# Patient Record
Sex: Female | Born: 1975
Health system: Southern US, Community
[De-identification: ages and names within clinical notes are randomized; demographics above are authoritative.]

## PROBLEM LIST (undated history)

## (undated) DIAGNOSIS — F419 Anxiety disorder, unspecified: Secondary | ICD-10-CM

## (undated) DIAGNOSIS — T7840XA Allergy, unspecified, initial encounter: Secondary | ICD-10-CM

## (undated) DIAGNOSIS — K219 Gastro-esophageal reflux disease without esophagitis: Secondary | ICD-10-CM

## (undated) DIAGNOSIS — R87619 Unspecified abnormal cytological findings in specimens from cervix uteri: Secondary | ICD-10-CM

## (undated) HISTORY — PX: INNER EAR SURGERY: SHX679

## (undated) HISTORY — DX: Anxiety disorder, unspecified: F41.9

## (undated) HISTORY — DX: Unspecified abnormal cytological findings in specimens from cervix uteri: R87.619

## (undated) HISTORY — DX: Allergy, unspecified, initial encounter: T78.40XA

## (undated) HISTORY — DX: Gastro-esophageal reflux disease without esophagitis: K21.9

---

## 2014-05-11 ENCOUNTER — Emergency Department (HOSPITAL_COMMUNITY)
Admission: EM | Admit: 2014-05-11 | Discharge: 2014-05-11 | Disposition: A | Discharge status: Home / Self Care | Payer: 59 | Source: Home / Self Care | Attending: Family Medicine | Admitting: Family Medicine

## 2014-05-11 ENCOUNTER — Encounter (HOSPITAL_COMMUNITY): Payer: Self-pay | Admitting: Emergency Medicine

## 2014-05-11 ENCOUNTER — Ambulatory Visit (HOSPITAL_COMMUNITY): Payer: 59 | Attending: Physician Assistant

## 2014-05-11 DIAGNOSIS — J3489 Other specified disorders of nose and nasal sinuses: Secondary | ICD-10-CM | POA: Diagnosis not present

## 2014-05-11 DIAGNOSIS — R51 Headache: Secondary | ICD-10-CM | POA: Insufficient documentation

## 2014-05-11 DIAGNOSIS — J342 Deviated nasal septum: Secondary | ICD-10-CM | POA: Insufficient documentation

## 2014-05-11 DIAGNOSIS — G43911 Migraine, unspecified, intractable, with status migrainosus: Secondary | ICD-10-CM

## 2014-05-11 DIAGNOSIS — Z9889 Other specified postprocedural states: Secondary | ICD-10-CM | POA: Insufficient documentation

## 2014-05-11 MED ORDER — DEXAMETHASONE SODIUM PHOSPHATE 10 MG/ML IJ SOLN
10.0000 mg | Freq: Once | INTRAMUSCULAR | Status: AC
  Administered 2014-05-11: 10 mg via INTRAMUSCULAR

## 2014-05-11 MED ORDER — DIPHENHYDRAMINE HCL 50 MG/ML IJ SOLN
25.0000 mg | Freq: Once | INTRAMUSCULAR | Status: AC
  Administered 2014-05-11: 25 mg via INTRAMUSCULAR

## 2014-05-11 MED ORDER — ONDANSETRON 4 MG PO TBDP
4.0000 mg | ORAL_TABLET | Freq: Once | ORAL | Status: AC
  Administered 2014-05-11: 4 mg via ORAL

## 2014-05-11 MED ORDER — ONDANSETRON HCL 4 MG PO TABS: 4.0000 mg | ORAL_TABLET | Freq: Three times a day (TID) | ORAL | Status: DC | PRN

## 2014-05-11 MED ORDER — DIPHENHYDRAMINE HCL 50 MG/ML IJ SOLN: 25.0000 mg | Freq: Once | INTRAMUSCULAR | Status: DC

## 2014-05-11 MED ORDER — DEXAMETHASONE SODIUM PHOSPHATE 10 MG/ML IJ SOLN
INTRAMUSCULAR | Status: AC
  Filled 2014-05-11: qty 1

## 2014-05-11 MED ORDER — SUMATRIPTAN SUCCINATE 100 MG PO TABS: ORAL_TABLET | ORAL | Status: DC

## 2014-05-11 MED ORDER — DIPHENHYDRAMINE HCL 50 MG/ML IJ SOLN
INTRAMUSCULAR | Status: AC
Start: 2014-05-11 — End: 2014-05-11
  Filled 2014-05-11: qty 1

## 2014-05-11 MED ORDER — ONDANSETRON 4 MG PO TBDP
ORAL_TABLET | ORAL | Status: AC
  Filled 2014-05-11: qty 1

## 2014-05-11 MED ORDER — KETOROLAC TROMETHAMINE 60 MG/2ML IM SOLN
INTRAMUSCULAR | Status: AC
  Filled 2014-05-11: qty 2

## 2014-05-11 MED ORDER — KETOROLAC TROMETHAMINE 60 MG/2ML IM SOLN
60.0000 mg | Freq: Once | INTRAMUSCULAR | Status: AC
  Administered 2014-05-11: 60 mg via INTRAMUSCULAR

## 2014-05-11 NOTE — Discharge Instructions (Signed)

## 2014-05-11 NOTE — ED Provider Notes (Signed)
CSN: [id number]     Arrival date & time 05/11/14  1149 History   First MD Initiated Contact with Patient 05/11/14 1246     Chief Complaint  Patient presents with  . Headache   (Consider location/radiation/quality/duration/timing/severity/associated sxs/prior Treatment) HPI Comments: States she has a remote hx of migraine headaches while in high school, but it has been many years since she has had a headache that was unrelieved by dose of naproxen. States the current headache began one week ago and has become progressively worse and over the past 24-48 hours she has had pain in early morning hours (4-5am) that has awaked her from sleep and has been associated with vomiting. States she has begun to feel unsteady on her feet. No relief with use of naprosyn.  Denies recent illness or injury PCP: none Is a smoker, but denies illicit drug or alcohol use. Works in Furniture conservator/restorer.    Patient is a 38 y.o. female presenting with headaches. The history is provided by the patient.  Headache Pain location:  Occipital Quality: +throbbing. Radiates to:  Does not radiate Onset quality:  Gradual Duration:  1 week Timing:  Constant Progression:  Worsening Chronicity:  New Similar to prior headaches: no   Associated symptoms: dizziness, loss of balance, nausea, neck stiffness, photophobia and vomiting   Associated symptoms: no abdominal pain, no back pain, no blurred vision, no congestion, no cough, no diarrhea, no drainage, no ear pain, no pain, no facial pain, no fatigue, no fever, no focal weakness, no hearing loss, no myalgias, no near-syncope, no neck pain, no numbness, no paresthesias, no seizures, no sinus pressure, no sore throat, no swollen glands, no syncope, no tingling, no URI, no visual change and no weakness     History reviewed. No pertinent past medical history. History reviewed. No pertinent past surgical history. History reviewed. No pertinent family history. History  Substance Use Topics   . Smoking status: Not on file  . Smokeless tobacco: Not on file  . Alcohol Use: No   OB History   Grav Para Term Preterm Abortions TAB SAB Ect Mult Living                 Review of Systems  Constitutional: Negative for fever, chills and fatigue.  HENT: Negative for congestion, ear pain, hearing loss, postnasal drip, sinus pressure and sore throat.   Eyes: Positive for photophobia. Negative for blurred vision and pain.  Respiratory: Negative for cough.   Cardiovascular: Negative.  Negative for syncope and near-syncope.  Gastrointestinal: Positive for nausea and vomiting. Negative for abdominal pain and diarrhea.  Endocrine: Negative for polydipsia, polyphagia and polyuria.  Genitourinary: Negative.   Musculoskeletal: Positive for neck stiffness. Negative for arthralgias, back pain, myalgias and neck pain.  Skin: Negative.   Neurological: Positive for dizziness, headaches and loss of balance. Negative for focal weakness, seizures, syncope, weakness, numbness and paresthesias.  Psychiatric/Behavioral: Negative for confusion.    Allergies  Review of patient's allergies indicates no known allergies.  Home Medications   Prior to Admission medications   Medication Sig Start Date End Date Taking? Authorizing Provider  IBUPROFEN IB PO Take by mouth.   Yes Historical Provider, MD  ondansetron (ZOFRAN) 4 MG tablet Take 1 tablet (4 mg total) by mouth every 8 (eight) hours as needed for nausea or vomiting. 05/11/14   Ria Clock, PA  SUMAtriptan (IMITREX) 100 MG tablet Take one at onset of headache. May repeat dose in two hours if headache persists. Do  not take more than 2 doses on 24 hours 05/11/14   Mathis Fare Jakerria Kingbird, PA   BP 153/92  Pulse 79  Temp(Src) 98.7 F (37.1 C) (Oral)  Resp 79  SpO2 99%  LMP 04/14/2014 Physical Exam  Nursing note and vitals reviewed. Constitutional: She is oriented to person, place, and time. She appears well-developed and well-nourished. No  distress.  HENT:  Head: Normocephalic and atraumatic.  Right Ear: External ear normal.  Left Ear: External ear normal.  Nose: Nose normal.  Mouth/Throat: Oropharynx is clear and moist.  Eyes: Conjunctivae and EOM are normal. Pupils are equal, round, and reactive to light. Right eye exhibits no discharge. Left eye exhibits no discharge. No scleral icterus.  Neck: Trachea normal, normal range of motion, full passive range of motion without pain and phonation normal. Neck supple. No mass present.  Cardiovascular: Normal rate, regular rhythm and normal heart sounds.   Pulmonary/Chest: Effort normal and breath sounds normal.  Abdominal: Soft. Bowel sounds are normal. She exhibits no distension. There is no tenderness.  Musculoskeletal: Normal range of motion.  Lymphadenopathy:    She has no cervical adenopathy.  Neurological: She is alert and oriented to person, place, and time. She has normal strength. No cranial nerve deficit or sensory deficit. Coordination and gait normal. GCS eye subscore is 4. GCS verbal subscore is 5. GCS motor subscore is 6.  Skin: Skin is warm and dry. No rash noted. No erythema.  Psychiatric: She has a normal mood and affect. Her behavior is normal.    ED Course  Procedures (including critical care time) Labs Review Labs Reviewed - No data to display  Imaging Review Ct Head Wo Contrast  05/11/2014   CLINICAL DATA:  Progressive headache  EXAM: CT HEAD WITHOUT CONTRAST  TECHNIQUE: Contiguous axial images were obtained from the base of the skull through the vertex without intravenous contrast.  COMPARISON:  None.  FINDINGS: The ventricles are normal in size and configuration. There is no mass, hemorrhage, extra-axial fluid collection, or midline shift. Gray-white compartments appear normal. No acute infarct apparent.  Bony calvarium appears intact. Patient is status post mastoidectomy on the left. The postoperative defect is clear on the left. Mastoids on the right are  clear. There is an air-fluid level in the left maxillary antrum. There is mucosal thickening in each maxillary antrum. There is leftward deviation of the nasal septum.  IMPRESSION: No intracranial lesion. No intracranial mass, hemorrhage, or acute appearing infarct.  Status post left mastoidectomy. Mastoids on the right are clear. There is paranasal sinus disease with an air-fluid level in the left maxillary antrum. There is leftward deviation of the nasal septum.   Electronically Signed   By: Bretta Bang M.D.   On: 05/11/2014 14:06     MDM   1. Intractable migraine with status migrainosus, unspecified migraine type   Non-contrast head CT without acute abnormality.  Patient given Toradol  IM, Decadron 10 mg IM, and benadryl  IM and reports moderate improvement of symptoms.  Will send with Rxs for zofran and imitrex to be used at home and a referral to [facility] if symptoms continue to reoccur.  Advised patient that if symptoms become suddenly worse or severe, patient should be re-evaluated at her nearest ER.     Ria Clock, [place] 05/11/14 [phone]

## 2014-05-11 NOTE — ED Notes (Signed)
Pt  Transported  To    Ct

## 2014-05-11 NOTE — ED Notes (Signed)
Pt  Reports  Symptoms   Symptoms  Of  Headache   With   Vomiting /  Photophobia    -  She  Reports  A  History of  Migraines   In past   But  Has   Been  A  Long time    -      She  Reports  Symptoms  Started  About 1  Week  Ago  But  Became  Worse  Today

## 2014-05-12 NOTE — ED Provider Notes (Signed)
Medical screening examination/treatment/procedure(s) were performed by a resident physician or non-physician practitioner and as the supervising physician I was immediately available for consultation/collaboration.  Shelly Flatten, MD Family Medicine   Ozella Rocks, MD 05/12/14 [phone]

## 2015-09-14 DIAGNOSIS — H5213 Myopia, bilateral: Secondary | ICD-10-CM | POA: Diagnosis not present

## 2015-09-14 DIAGNOSIS — H52223 Regular astigmatism, bilateral: Secondary | ICD-10-CM | POA: Diagnosis not present

## 2016-06-26 ENCOUNTER — Encounter (INDEPENDENT_AMBULATORY_CARE_PROVIDER_SITE_OTHER): Payer: Self-pay

## 2016-06-26 ENCOUNTER — Ambulatory Visit (INDEPENDENT_AMBULATORY_CARE_PROVIDER_SITE_OTHER): Payer: 59 | Admitting: Family Medicine

## 2016-06-26 ENCOUNTER — Encounter: Payer: Self-pay | Admitting: Family Medicine

## 2016-06-26 VITALS — BP 124/80 | HR 68 | Temp 98.9°F | Resp 16 | Ht 67.0 in | Wt 168.1 lb

## 2016-06-26 DIAGNOSIS — L500 Allergic urticaria: Secondary | ICD-10-CM | POA: Insufficient documentation

## 2016-06-26 DIAGNOSIS — Z7689 Persons encountering health services in other specified circumstances: Secondary | ICD-10-CM | POA: Diagnosis not present

## 2016-06-26 DIAGNOSIS — G43909 Migraine, unspecified, not intractable, without status migrainosus: Secondary | ICD-10-CM | POA: Insufficient documentation

## 2016-06-26 DIAGNOSIS — G43C0 Periodic headache syndromes in child or adult, not intractable: Secondary | ICD-10-CM | POA: Diagnosis not present

## 2016-06-26 DIAGNOSIS — K219 Gastro-esophageal reflux disease without esophagitis: Secondary | ICD-10-CM | POA: Diagnosis not present

## 2016-06-26 DIAGNOSIS — F418 Other specified anxiety disorders: Secondary | ICD-10-CM | POA: Diagnosis not present

## 2016-06-26 MED ORDER — SUMATRIPTAN SUCCINATE 100 MG PO TABS: ORAL_TABLET | ORAL | 1 refills | Status: AC

## 2016-06-26 MED ORDER — ONDANSETRON HCL 4 MG PO TABS: 4.0000 mg | ORAL_TABLET | Freq: Three times a day (TID) | ORAL | 1 refills | Status: AC | PRN

## 2016-06-26 NOTE — Progress Notes (Signed)
Chief Complaint  Patient presents with  . Establish Care   New to establish prior care through GYN Is due for GYN follow up.   Seen regularly for abnormal PAP Discussed at 40 she may be due for screening mammo No regular exercise No ongoing medical problems Takes OTC medicine for occasional GERD Takes OTC antihistamines for occasional urticaria brought on be stress Needs refill of migraine medicine, also caused by stress.  Has headache less than once a month    Patient Active Problem List   Diagnosis Date Noted  . Migraine 06/26/2016  . GERD (gastroesophageal reflux disease) 06/26/2016  . Situational anxiety 06/26/2016  . Allergic urticaria 06/26/2016    Outpatient Encounter Prescriptions as of 06/26/2016  Medication Sig  . IBUPROFEN IB PO Take by mouth.  . ondansetron (ZOFRAN) 4 MG tablet Take 1 tablet (4 mg total) by mouth every 8 (eight) hours as needed for nausea or vomiting.  . SUMAtriptan (IMITREX) 100 MG tablet Take one at onset of headache. May repeat dose in two hours if headache persists. Do not take more than 2 doses on 24 hours   No facility-administered encounter medications on file as of 06/26/2016.     Past Medical History:  Diagnosis Date  . Abnormal Pap smear of cervix    had colposcopy  . Allergy   . Anxiety   . GERD (gastroesophageal reflux disease)     Past Surgical History:  Procedure Laterality Date  . INNER EAR SURGERY     ~ [id number]    Social History   Social History  . Marital status: Married    Spouse name: Judie GrieveBryan  . Number of children: 3  . Years of education: 14   Occupational History  . 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 Smoking status: Current Every Day Smoker    Packs/day: 0.50    Types: Cigarettes    Start date: 08/22/2011  . Smokeless tobacco: Never Used     Comment: working on quitting  . Alcohol use No  . Drug use: No  . Sexual activity: Yes    Birth control/ protection: Condom    Other Topics Concern  . 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 Arthritis Mother   . Asthma Mother   . Heart disease Mother   . Diabetes Mother   . Hearing loss Mother   . Hyperlipidemia Mother   . Miscarriages / [place] Mother   . Kidney disease Father   . Kidney disease Sister     brights disease  . Asthma Son   . Heart disease Maternal Grandfather   . Cancer Maternal Grandfather   . Asthma Son     Review of Systems  Constitutional: Negative for chills, fever and weight loss.  HENT: Negative for congestion and hearing loss.   Eyes: Negative for blurred vision and pain.  Respiratory: Negative for cough and shortness of breath.   Cardiovascular: Negative for chest pain and leg swelling.  Gastrointestinal: Negative for abdominal pain, constipation, diarrhea and heartburn.  Genitourinary: Negative for dysuria and frequency.  Musculoskeletal: Negative for falls, joint pain and myalgias.  Neurological: Negative for dizziness, seizures and headaches.  Psychiatric/Behavioral: Negative for depression. The patient is not nervous/anxious and does not have insomnia.  BP 124/80 (BP Location: Right Arm, Patient Position: Sitting, Cuff Size: Normal)   Pulse 68   Temp 98.9 F (37.2 C) (Oral)   Resp 16   Ht 5\' 7"  (1.702 m)   Wt 168 lb 1.9 oz (76.3 kg)   LMP 06/21/2016   SpO2 99%   BMI 26.33 kg/m   Physical Exam  Constitutional: She is oriented to person, place, and time. She appears well-developed and well-nourished.  HENT:  Head: Normocephalic and atraumatic.  Mouth/Throat: Oropharynx is clear and moist.  Eyes: Conjunctivae are normal. Pupils are equal, round, and reactive to light.  Neck: Normal range of motion. Neck supple.  Musculoskeletal: Normal range of motion. She exhibits no edema.  Neurological: She is alert and  oriented to person, place, and time.  Gait normal  Skin: Skin is warm and dry.  Psychiatric: She has a normal mood and affect. Her behavior is normal. Thought content normal.  Nursing note and vitals reviewed.  ASSESSMENT/PLAN:  1. Encounter to establish care with new doctor  - CBC - BASIC METABOLIC PANEL WITH GFR - Lipid panel - TSH - VITAMIN D 25 Hydroxy (Vit-D Deficiency, Fractures)  2. Periodic headache syndrome, not intractable Continue current prn medicine  3. Gastroesophageal reflux disease, esophagitis presence not specified Continue current OTC medicine  4. Situational anxiety Discussed - managed.  Denies need for medicine or counseling.  5. Allergic urticaria Continue current OTC medicine    Patient Instructions  Due for GYN evaluation and mammogram  I have ordered lab testing  I will send you a letter with your test results.  If there is anything of concern, we will call right away.  Continue to eat well Try to get enough exercise  See me once a year Call sooner for problems     Eustace MooreYvonne Sue Jobanny Mavis, MD

## 2016-06-26 NOTE — Patient Instructions (Addendum)
Due for GYN evaluation and mammogram  I have ordered lab testing  I will send you a letter with your test results.  If there is anything of concern, we will call right away.  Continue to eat well Try to get enough exercise  See me once a year Call sooner for problems

## 2016-09-19 DIAGNOSIS — Z720 Tobacco use: Secondary | ICD-10-CM | POA: Diagnosis not present

## 2016-09-19 DIAGNOSIS — Z6826 Body mass index (BMI) 26.0-26.9, adult: Secondary | ICD-10-CM | POA: Diagnosis not present

## 2016-09-19 DIAGNOSIS — Z01419 Encounter for gynecological examination (general) (routine) without abnormal findings: Secondary | ICD-10-CM | POA: Diagnosis not present

## 2016-09-19 DIAGNOSIS — Z800 Family history of malignant neoplasm of digestive organs: Secondary | ICD-10-CM | POA: Diagnosis not present

## 2016-09-19 DIAGNOSIS — F17200 Nicotine dependence, unspecified, uncomplicated: Secondary | ICD-10-CM | POA: Diagnosis not present

## 2016-09-19 DIAGNOSIS — N951 Menopausal and female climacteric states: Secondary | ICD-10-CM | POA: Diagnosis not present

## 2016-10-24 ENCOUNTER — Encounter: Payer: Self-pay | Admitting: Family Medicine

## 2016-10-24 ENCOUNTER — Ambulatory Visit (INDEPENDENT_AMBULATORY_CARE_PROVIDER_SITE_OTHER): Payer: 59 | Admitting: Family Medicine

## 2016-10-24 VITALS — BP 128/86 | HR 84 | Temp 98.3°F | Resp 16 | Ht 67.0 in | Wt 173.1 lb

## 2016-10-24 DIAGNOSIS — M2580 Other specified joint disorders, unspecified joint: Secondary | ICD-10-CM

## 2016-10-24 NOTE — Progress Notes (Signed)
Chief Complaint  Patient presents with  . Foot Swelling    right x 2 days   Right foot pain for 2 days. It hurts underneath the ball of her foot. The area is swollen and red. She's had no trauma, no injury, no overuse or increase in her activity. No prolonged standing or walking. No change in shoe wear. No prior history of foot problems. No prior history of gout. No prior x-rays of her foot. She stayed off her foot for 1 day, used ice and acetaminophen,  has seen some improvement. It is very painful with any touching of this area, any pressure on the area, and walking. It is not painful at rest.  Patient Active Problem List   Diagnosis Date Noted  . Migraine 06/26/2016  . GERD (gastroesophageal reflux disease) 06/26/2016  . Situational anxiety 06/26/2016  . Allergic urticaria 06/26/2016    Outpatient Encounter Prescriptions as of 10/24/2016  Medication Sig  . IBUPROFEN IB PO Take by mouth.  . ondansetron (ZOFRAN) 4 MG tablet Take 1 tablet (4 mg total) by mouth every 8 (eight) hours as needed for nausea or vomiting.  . SUMAtriptan (IMITREX) 100 MG tablet Take one at onset of headache. May repeat dose in two hours if headache persists. Do not take more than 2 doses on 24 hours   No facility-administered encounter medications on file as of 10/24/2016.     Allergies  Allergen Reactions  . Penicillins Hives    Review of Systems  Constitutional: Negative for chills and fever.  HENT: Negative for congestion and sinus pain.        No recent illness  Eyes: Negative for visual disturbance.  Respiratory: Negative for cough.   Musculoskeletal: Positive for gait problem.       In right foot  Neurological: Positive for headaches. Negative for light-headedness.       Chronic migraine problem   see history of present illness   BP 128/86 (BP Location: Right Arm, Patient Position: Sitting, Cuff Size: Normal)   Pulse 84   Temp 98.3 F (36.8 C) (Temporal)   Resp 16   Ht 5\' 7"  (1.702 m)    Wt 173 lb 1.3 oz (78.5 kg)   LMP 08/02/2016 (Approximate)   SpO2 98%   BMI 27.11 kg/m   Physical Exam  Constitutional: She is oriented to person, place, and time. She appears well-developed and well-nourished. No distress.  HENT:  Head: Normocephalic and atraumatic.  Mouth/Throat: Oropharynx is clear and moist.  Eyes: Conjunctivae are normal. Pupils are equal, round, and reactive to light.  Musculoskeletal: Normal range of motion. She exhibits no edema.       Feet:  Antalgic gait. Point tenderness to palpation over the lateral sesamoid, plantar aspect of right foot at the MCP joint. The area is mildly swollen. Area is faintly red. Warmth to palpation. No pain with movement of the toe or ankle.  Neurological: She is alert and oriented to person, place, and time.  Psychiatric: She has a normal mood and affect. Her behavior is normal.    ASSESSMENT/PLAN:  1. Sesamoiditis    Patient Instructions  Ice Pad shoe Ibuprofen three times a day with food  Let me know if it does not get better in a week or so Sesamoid Injury A sesamoid injury happens when a sesamoid bone or a surrounding tendon gets damaged during activity. A sesamoid bone is a bone that is connected to a tendon or muscle but not to a  joint. Your kneecap is an example of a sesamoid bone. Examples of sesamoid injuries include irritation, dislocation, or a crack or break (fracture) in a sesamoid bone. Most sesamoid injuries affect the sesamoid bones under the big toe. These bones help you to move forward during weight-bearing activities. What are the causes? This condition is caused by damage to a sesamoid bone or a surrounding tendon. What increases the risk? This condition is more likely to develop in people who:  Dance ballet.  Run.  Play sports.  Are active on artificial turf.  Wear high heels. What are the signs or symptoms? Symptoms of this condition include:  Pain under the big toe.  Pain when you try  to straighten your big toe.  A popping sound that happens at the time of injury.  Swelling.  Bruising. How is this diagnosed? This condition is diagnosed with:  A physical exam.  Observation of your movement while you walk.  An X-ray.  Bone scans. How is this treated? Treatment for this condition depends on the location, type, and severity of the injury. Treatment may involve:  Resting the affected area.  Applying ice to the affected area.  Taking over-the-counter pain medicine.  Placing a cushioned pad in the shoe.  Avoiding activities that are causing injury.  Taping your toe to prevent movement.  Getting steroid injections.  Wearing a cast, brace, or orthotic shoe.  Physical therapy.  Surgery. Follow these instructions at home: If you have a cast:   Do not stick anything inside the cast to scratch your skin. Doing that increases your risk of infection.  Check the skin around the cast every day. Report any concerns to your health care provider. You may put lotion on dry skin around the edges of the cast. Do not apply lotion to the skin underneath the cast.  Do not put pressure on any part of the cast until it is fully hardened. This may take several hours.  Do not let your cast get wet if it is not waterproof.  Keep the cast clean. If you have a brace:   Wear the brace as told by your health care provider. Remove it only as told by your health care provider.  Loosen the brace if your toes become numb and tingle, or if they turn cold and blue.  Do not let your brace get wet if it is not waterproof.  Keep the brace clean. Bathing   Do not take baths, swim, or use a hot tub until your health care provider approves. Ask your health care provider if you can take showers. You may only be allowed to take sponge baths for bathing.  If your cast or brace is not waterproof, cover it with a watertight plastic bag when you take a bath or shower. Managing pain,  stiffness, and swelling   If directed, apply ice to the injured area.  Put ice in a plastic bag.  Place a towel between your skin and the bag.  Leave the ice on for 20 minutes, 2-3 times per day.  Move your toes often to avoid stiffness and to lessen swelling.  Raise (elevate) the injured area above the level of your heart while you are sitting or lying down. Driving   Do not drive or operate heavy machinery while taking certain prescription pain medicines.  Ask your health care provider when it is safe to drive if you have a cast, brace, or orthotic shoe on a foot that you use for  driving. Activity   Return to your normal activities as told by your health care provider. Ask your health care provider what activities are safe for you.  Do exercises as told by your health care provider or physical therapist. Safety   Do not use the injured limb to support your body weight until your health care provider says that you can. Use crutches as told by your health care provider. General instructions   Do not use any tobacco products, including cigarettes, chewing tobacco, or e-cigarettes. Tobacco can delay bone healing. If you need help quitting, ask your health care provider.  Take over-the-counter and prescription medicines only as told by your health care provider.  Keep all follow-up visits as told by your health care provider. This is important. Contact a health care provider if:  Pain and swelling continue even with treatment.  Pain and swelling return after you get back to your normal activities.  You cannot put pressure on your foot. Get help right away if:  You lose sensation in your foot.  Your toes turn cold and blue. This information is not intended to replace advice given to you by your health care provider. Make sure you discuss any questions you have with your health care provider. Document Released: 08/07/2005 Document Revised: 07/06/2016 Document Reviewed:  02/17/2015 Elsevier Interactive Patient Education  2017 [employer]    Eustace Moore, MD

## 2016-10-24 NOTE — Patient Instructions (Addendum)
Ice Pad shoe Ibuprofen three times a day with food  Let me know if it does not get better in a week or so Sesamoid Injury A sesamoid injury happens when a sesamoid bone or a surrounding tendon gets damaged during activity. A sesamoid bone is a bone that is connected to a tendon or muscle but not to a joint. Your kneecap is an example of a sesamoid bone. Examples of sesamoid injuries include irritation, dislocation, or a crack or break (fracture) in a sesamoid bone. Most sesamoid injuries affect the sesamoid bones under the big toe. These bones help you to move forward during weight-bearing activities. What are the causes? This condition is caused by damage to a sesamoid bone or a surrounding tendon. What increases the risk? This condition is more likely to develop in people who:  Dance ballet.  Run.  Play sports.  Are active on artificial turf.  Wear high heels. What are the signs or symptoms? Symptoms of this condition include:  Pain under the big toe.  Pain when you try to straighten your big toe.  A popping sound that happens at the time of injury.  Swelling.  Bruising. How is this diagnosed? This condition is diagnosed with:  A physical exam.  Observation of your movement while you walk.  An X-ray.  Bone scans. How is this treated? Treatment for this condition depends on the location, type, and severity of the injury. Treatment may involve:  Resting the affected area.  Applying ice to the affected area.  Taking over-the-counter pain medicine.  Placing a cushioned pad in the shoe.  Avoiding activities that are causing injury.  Taping your toe to prevent movement.  Getting steroid injections.  Wearing a cast, brace, or orthotic shoe.  Physical therapy.  Surgery. Follow these instructions at home: If you have a cast:   Do not stick anything inside the cast to scratch your skin. Doing that increases your risk of infection.  Check the skin around  the cast every day. Report any concerns to your health care provider. You may put lotion on dry skin around the edges of the cast. Do not apply lotion to the skin underneath the cast.  Do not put pressure on any part of the cast until it is fully hardened. This may take several hours.  Do not let your cast get wet if it is not waterproof.  Keep the cast clean. If you have a brace:   Wear the brace as told by your health care provider. Remove it only as told by your health care provider.  Loosen the brace if your toes become numb and tingle, or if they turn cold and blue.  Do not let your brace get wet if it is not waterproof.  Keep the brace clean. Bathing   Do not take baths, swim, or use a hot tub until your health care provider approves. Ask your health care provider if you can take showers. You may only be allowed to take sponge baths for bathing.  If your cast or brace is not waterproof, cover it with a watertight plastic bag when you take a bath or shower. Managing pain, stiffness, and swelling   If directed, apply ice to the injured area.  Put ice in a plastic bag.  Place a towel between your skin and the bag.  Leave the ice on for 20 minutes, 2-3 times per day.  Move your toes often to avoid stiffness and to lessen swelling.  Raise (elevate)  the injured area above the level of your heart while you are sitting or lying down. Driving   Do not drive or operate heavy machinery while taking certain prescription pain medicines.  Ask your health care provider when it is safe to drive if you have a cast, brace, or orthotic shoe on a foot that you use for driving. Activity   Return to your normal activities as told by your health care provider. Ask your health care provider what activities are safe for you.  Do exercises as told by your health care provider or physical therapist. Safety   Do not use the injured limb to support your body weight until your health care  provider says that you can. Use crutches as told by your health care provider. General instructions   Do not use any tobacco products, including cigarettes, chewing tobacco, or e-cigarettes. Tobacco can delay bone healing. If you need help quitting, ask your health care provider.  Take over-the-counter and prescription medicines only as told by your health care provider.  Keep all follow-up visits as told by your health care provider. This is important. Contact a health care provider if:  Pain and swelling continue even with treatment.  Pain and swelling return after you get back to your normal activities.  You cannot put pressure on your foot. Get help right away if:  You lose sensation in your foot.  Your toes turn cold and blue. This information is not intended to replace advice given to you by your health care provider. Make sure you discuss any questions you have with your health care provider. Document Released: 08/07/2005 Document Revised: 07/06/2016 Document Reviewed: 02/17/2015 [employer] Interactive Patient Education  2017 [employer]

## 2017-06-26 ENCOUNTER — Ambulatory Visit: Payer: 59 | Admitting: Family Medicine

## 2018-01-25 ENCOUNTER — Encounter: Payer: Self-pay | Admitting: Family Medicine

## 2019-04-08 DIAGNOSIS — G43109 Migraine with aura, not intractable, without status migrainosus: Secondary | ICD-10-CM | POA: Diagnosis not present

## 2019-04-08 DIAGNOSIS — F17219 Nicotine dependence, cigarettes, with unspecified nicotine-induced disorders: Secondary | ICD-10-CM | POA: Diagnosis not present

## 2019-04-08 DIAGNOSIS — Z716 Tobacco abuse counseling: Secondary | ICD-10-CM | POA: Diagnosis not present

## 2019-04-08 DIAGNOSIS — E663 Overweight: Secondary | ICD-10-CM | POA: Diagnosis not present

## 2019-04-08 DIAGNOSIS — Z6828 Body mass index (BMI) 28.0-28.9, adult: Secondary | ICD-10-CM | POA: Diagnosis not present

## 2019-04-08 MED FILL — SUMATRIPTAN SUCC 25 MG TAB: 25 | 30 days supply | Qty: 9 | Fill #0

## 2019-04-09 ENCOUNTER — Other Ambulatory Visit (HOSPITAL_COMMUNITY): Payer: Self-pay | Admitting: Family Medicine

## 2019-04-09 DIAGNOSIS — Z1231 Encounter for screening mammogram for malignant neoplasm of breast: Secondary | ICD-10-CM

## 2019-04-18 DIAGNOSIS — H5213 Myopia, bilateral: Secondary | ICD-10-CM | POA: Diagnosis not present

## 2019-04-23 ENCOUNTER — Inpatient Hospital Stay (HOSPITAL_COMMUNITY): Admission: RE | Admit: 2019-04-23 | Payer: 59 | Source: Ambulatory Visit

## 2019-05-08 DIAGNOSIS — Z1329 Encounter for screening for other suspected endocrine disorder: Secondary | ICD-10-CM | POA: Diagnosis not present

## 2019-05-08 DIAGNOSIS — E663 Overweight: Secondary | ICD-10-CM | POA: Diagnosis not present

## 2019-05-08 DIAGNOSIS — Z0000 Encounter for general adult medical examination without abnormal findings: Secondary | ICD-10-CM | POA: Diagnosis not present

## 2019-05-14 DIAGNOSIS — E663 Overweight: Secondary | ICD-10-CM | POA: Diagnosis not present

## 2019-05-14 DIAGNOSIS — E782 Mixed hyperlipidemia: Secondary | ICD-10-CM | POA: Diagnosis not present

## 2019-05-14 DIAGNOSIS — Z2821 Immunization not carried out because of patient refusal: Secondary | ICD-10-CM | POA: Diagnosis not present

## 2019-05-14 DIAGNOSIS — Z0001 Encounter for general adult medical examination with abnormal findings: Secondary | ICD-10-CM | POA: Diagnosis not present

## 2019-05-14 DIAGNOSIS — Z6828 Body mass index (BMI) 28.0-28.9, adult: Secondary | ICD-10-CM | POA: Diagnosis not present

## 2019-05-14 DIAGNOSIS — R7301 Impaired fasting glucose: Secondary | ICD-10-CM | POA: Diagnosis not present

## 2019-05-14 DIAGNOSIS — G43109 Migraine with aura, not intractable, without status migrainosus: Secondary | ICD-10-CM | POA: Diagnosis not present

## 2019-05-14 DIAGNOSIS — R945 Abnormal results of liver function studies: Secondary | ICD-10-CM | POA: Diagnosis not present

## 2019-05-14 DIAGNOSIS — F17210 Nicotine dependence, cigarettes, uncomplicated: Secondary | ICD-10-CM | POA: Diagnosis not present

## 2019-05-14 MED FILL — ROSUVASTATIN CALCIUM 5 MG T: 5 | 30 days supply | Qty: 30 | Fill #0

## 2019-05-19 ENCOUNTER — Other Ambulatory Visit: Payer: Self-pay

## 2019-05-19 ENCOUNTER — Ambulatory Visit (HOSPITAL_COMMUNITY)
Admission: RE | Admit: 2019-05-19 | Discharge: 2019-05-19 | Disposition: A | Discharge status: Home / Self Care | Payer: 59 | Source: Ambulatory Visit | Attending: Family Medicine | Admitting: Family Medicine

## 2019-05-19 DIAGNOSIS — Z1231 Encounter for screening mammogram for malignant neoplasm of breast: Secondary | ICD-10-CM | POA: Insufficient documentation

## 2019-05-22 MED FILL — SUMATRIPTAN SUCC 25 MG TAB: 25 | 30 days supply | Qty: 9 | Fill #0

## 2019-06-11 ENCOUNTER — Other Ambulatory Visit: Payer: Self-pay

## 2019-06-11 DIAGNOSIS — Z20822 Contact with and (suspected) exposure to covid-19: Secondary | ICD-10-CM

## 2019-06-11 DIAGNOSIS — Z20828 Contact with and (suspected) exposure to other viral communicable diseases: Secondary | ICD-10-CM | POA: Diagnosis not present

## 2019-06-13 LAB — NOVEL CORONAVIRUS, NAA: SARS-CoV-2, NAA: NOT DETECTED

## 2019-08-13 DIAGNOSIS — R7301 Impaired fasting glucose: Secondary | ICD-10-CM | POA: Diagnosis not present

## 2019-08-13 DIAGNOSIS — F17210 Nicotine dependence, cigarettes, uncomplicated: Secondary | ICD-10-CM | POA: Diagnosis not present

## 2019-08-13 DIAGNOSIS — Z716 Tobacco abuse counseling: Secondary | ICD-10-CM | POA: Diagnosis not present

## 2019-08-13 DIAGNOSIS — E782 Mixed hyperlipidemia: Secondary | ICD-10-CM | POA: Diagnosis not present

## 2019-08-13 DIAGNOSIS — R945 Abnormal results of liver function studies: Secondary | ICD-10-CM | POA: Diagnosis not present

## 2019-08-13 DIAGNOSIS — Z712 Person consulting for explanation of examination or test findings: Secondary | ICD-10-CM | POA: Diagnosis not present

## 2019-08-13 DIAGNOSIS — Z2821 Immunization not carried out because of patient refusal: Secondary | ICD-10-CM | POA: Diagnosis not present

## 2019-08-13 DIAGNOSIS — E663 Overweight: Secondary | ICD-10-CM | POA: Diagnosis not present

## 2019-08-13 DIAGNOSIS — F17219 Nicotine dependence, cigarettes, with unspecified nicotine-induced disorders: Secondary | ICD-10-CM | POA: Diagnosis not present

## 2019-08-21 DIAGNOSIS — E663 Overweight: Secondary | ICD-10-CM | POA: Diagnosis not present

## 2019-08-21 DIAGNOSIS — Z6828 Body mass index (BMI) 28.0-28.9, adult: Secondary | ICD-10-CM | POA: Diagnosis not present

## 2019-08-21 DIAGNOSIS — E782 Mixed hyperlipidemia: Secondary | ICD-10-CM | POA: Diagnosis not present

## 2019-08-21 DIAGNOSIS — F17210 Nicotine dependence, cigarettes, uncomplicated: Secondary | ICD-10-CM | POA: Diagnosis not present

## 2019-08-21 DIAGNOSIS — G43109 Migraine with aura, not intractable, without status migrainosus: Secondary | ICD-10-CM | POA: Diagnosis not present

## 2019-08-21 DIAGNOSIS — R7301 Impaired fasting glucose: Secondary | ICD-10-CM | POA: Diagnosis not present

## 2019-08-21 DIAGNOSIS — R945 Abnormal results of liver function studies: Secondary | ICD-10-CM | POA: Diagnosis not present

## 2019-08-21 DIAGNOSIS — Z2821 Immunization not carried out because of patient refusal: Secondary | ICD-10-CM | POA: Diagnosis not present

## 2019-08-21 MED FILL — SM NICOTINE 2 MG CHEWING GU: 2 | 30 days supply | Qty: 110 | Fill #0

## 2019-08-21 MED FILL — ROSUVASTATIN CALCIUM 5 MG T: 5 | 30 days supply | Qty: 30 | Fill #0

## 2019-09-05 ENCOUNTER — Other Ambulatory Visit: Payer: 59

## 2019-12-01 DIAGNOSIS — Z0189 Encounter for other specified special examinations: Secondary | ICD-10-CM | POA: Diagnosis not present

## 2019-12-01 DIAGNOSIS — Z0001 Encounter for general adult medical examination with abnormal findings: Secondary | ICD-10-CM | POA: Diagnosis not present

## 2019-12-01 DIAGNOSIS — F17219 Nicotine dependence, cigarettes, with unspecified nicotine-induced disorders: Secondary | ICD-10-CM | POA: Diagnosis not present

## 2019-12-01 DIAGNOSIS — G43109 Migraine with aura, not intractable, without status migrainosus: Secondary | ICD-10-CM | POA: Diagnosis not present

## 2019-12-01 DIAGNOSIS — E663 Overweight: Secondary | ICD-10-CM | POA: Diagnosis not present

## 2019-12-01 DIAGNOSIS — F17210 Nicotine dependence, cigarettes, uncomplicated: Secondary | ICD-10-CM | POA: Diagnosis not present

## 2019-12-01 DIAGNOSIS — R7301 Impaired fasting glucose: Secondary | ICD-10-CM | POA: Diagnosis not present

## 2019-12-01 DIAGNOSIS — E782 Mixed hyperlipidemia: Secondary | ICD-10-CM | POA: Diagnosis not present

## 2019-12-01 DIAGNOSIS — R945 Abnormal results of liver function studies: Secondary | ICD-10-CM | POA: Diagnosis not present

## 2019-12-03 DIAGNOSIS — Z6828 Body mass index (BMI) 28.0-28.9, adult: Secondary | ICD-10-CM | POA: Diagnosis not present

## 2019-12-03 DIAGNOSIS — G43109 Migraine with aura, not intractable, without status migrainosus: Secondary | ICD-10-CM | POA: Diagnosis not present

## 2019-12-03 DIAGNOSIS — Z2821 Immunization not carried out because of patient refusal: Secondary | ICD-10-CM | POA: Diagnosis not present

## 2019-12-03 DIAGNOSIS — E782 Mixed hyperlipidemia: Secondary | ICD-10-CM | POA: Diagnosis not present

## 2019-12-03 DIAGNOSIS — F17210 Nicotine dependence, cigarettes, uncomplicated: Secondary | ICD-10-CM | POA: Diagnosis not present

## 2019-12-03 DIAGNOSIS — E663 Overweight: Secondary | ICD-10-CM | POA: Diagnosis not present

## 2020-01-16 MED FILL — ROSUVASTATIN CALCIUM 5 MG T: 5 | 30 days supply | Qty: 30 | Fill #1

## 2020-03-03 IMAGING — MG MM DIGITAL SCREENING BILAT W/ TOMO W/ CAD
8 series · 8 of 24 positions shown · non-contrast
Comparison: Previous exam(s).

CLINICAL DATA: Screening.

EXAM:
DIGITAL SCREENING BILATERAL MAMMOGRAM WITH TOMO AND CAD

[R CC synth-2D]
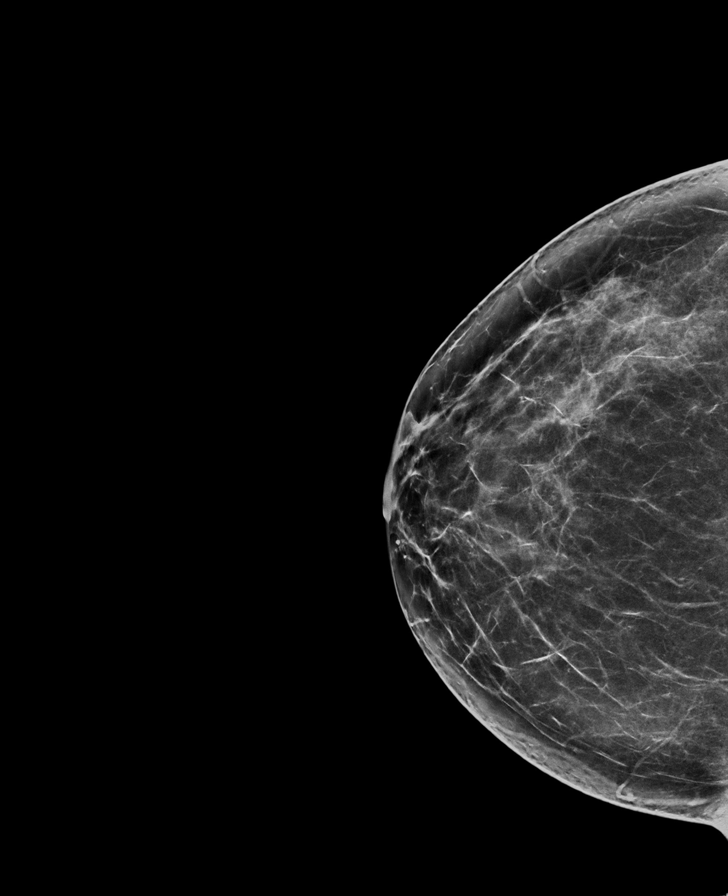

[R MLO synth-2D]
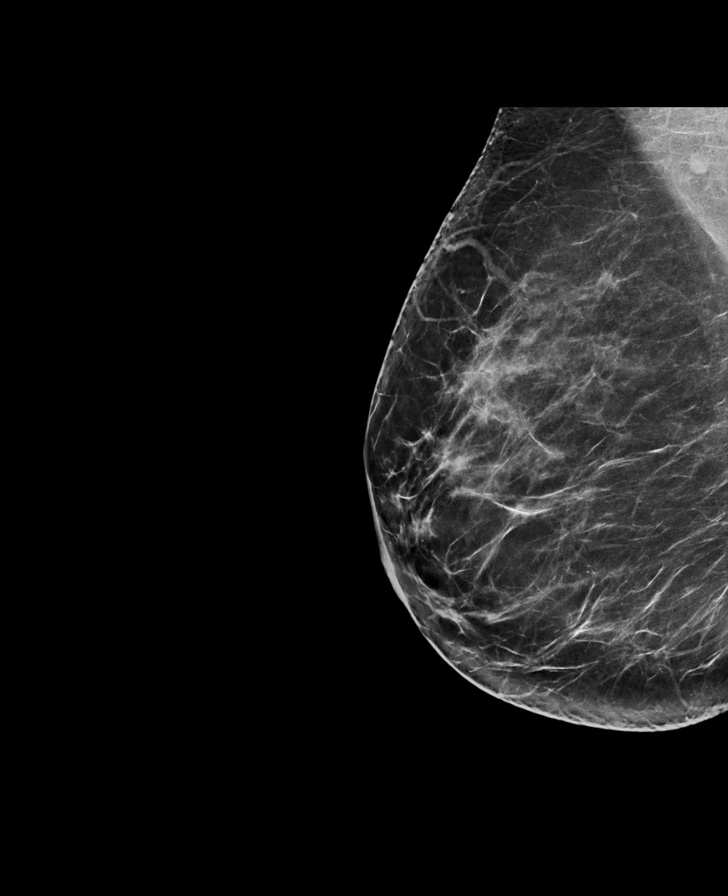

[L MLO synth-2D]
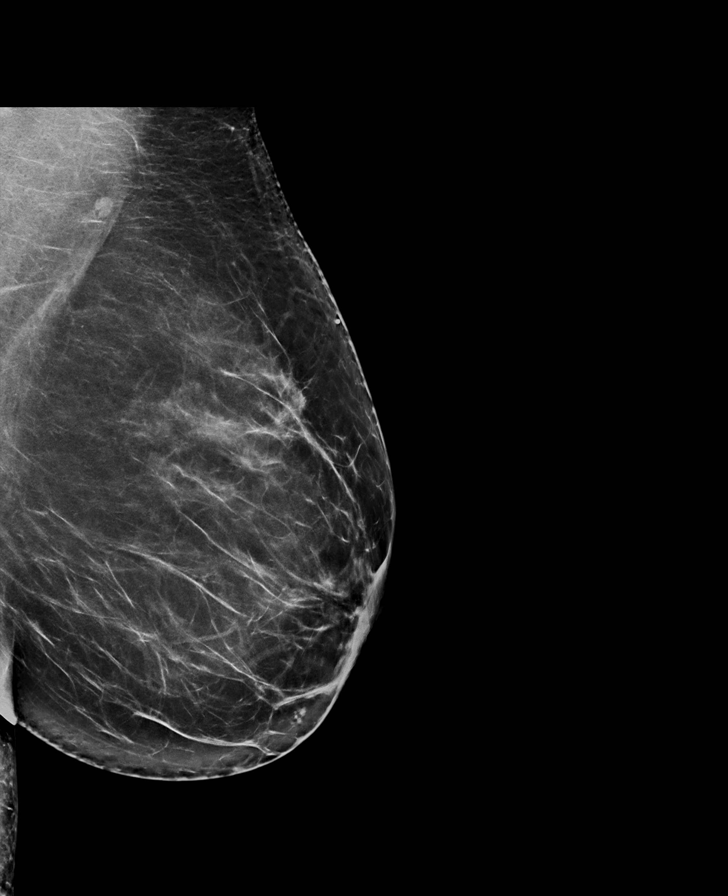

[L CC synth-2D]
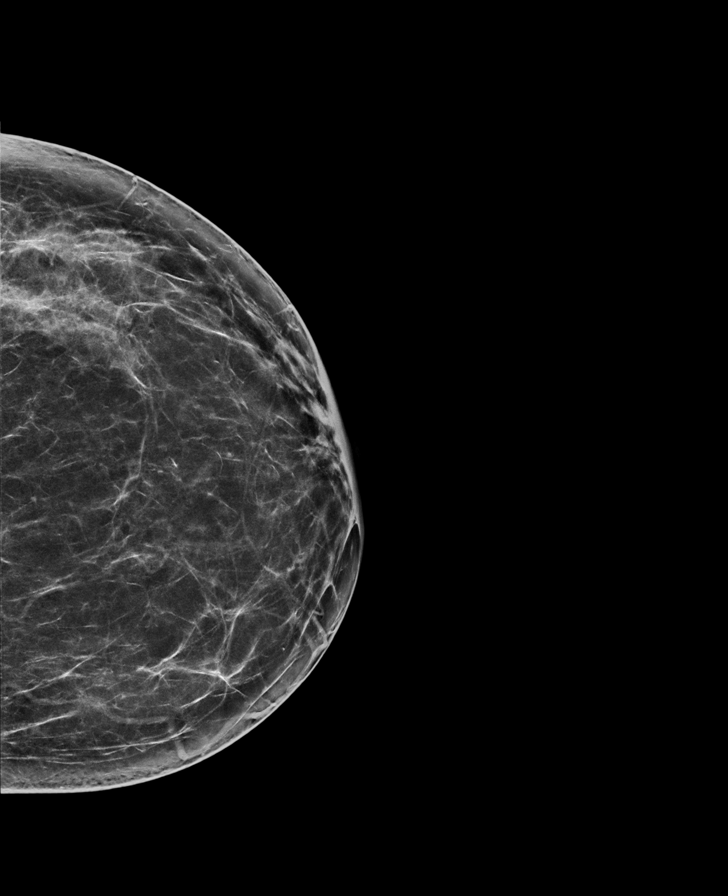

[R CC tomo · tomo slice 39/77.0]
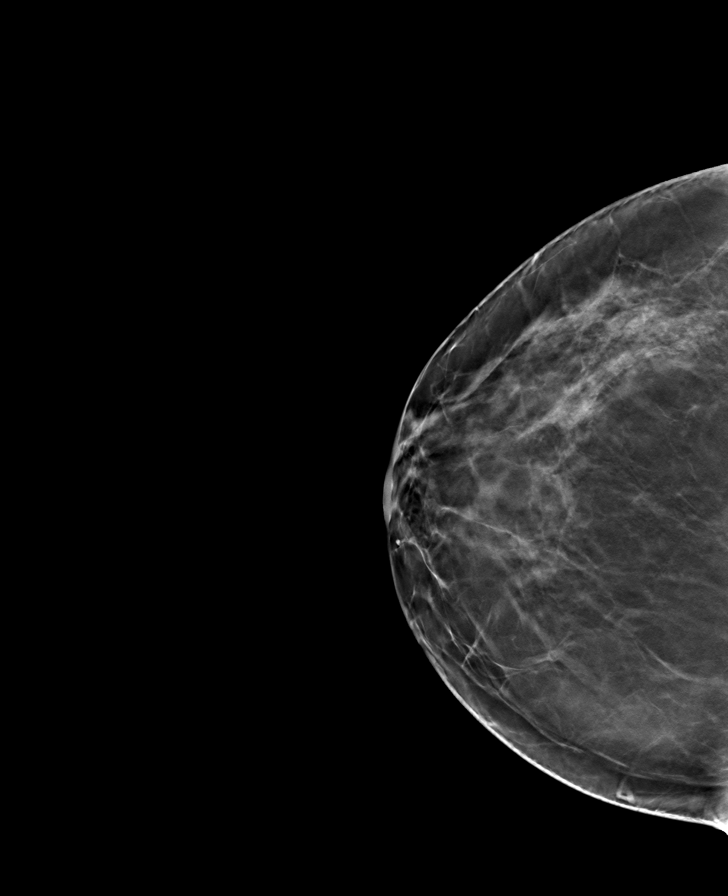

[L MLO tomo · tomo slice 47/94.0]
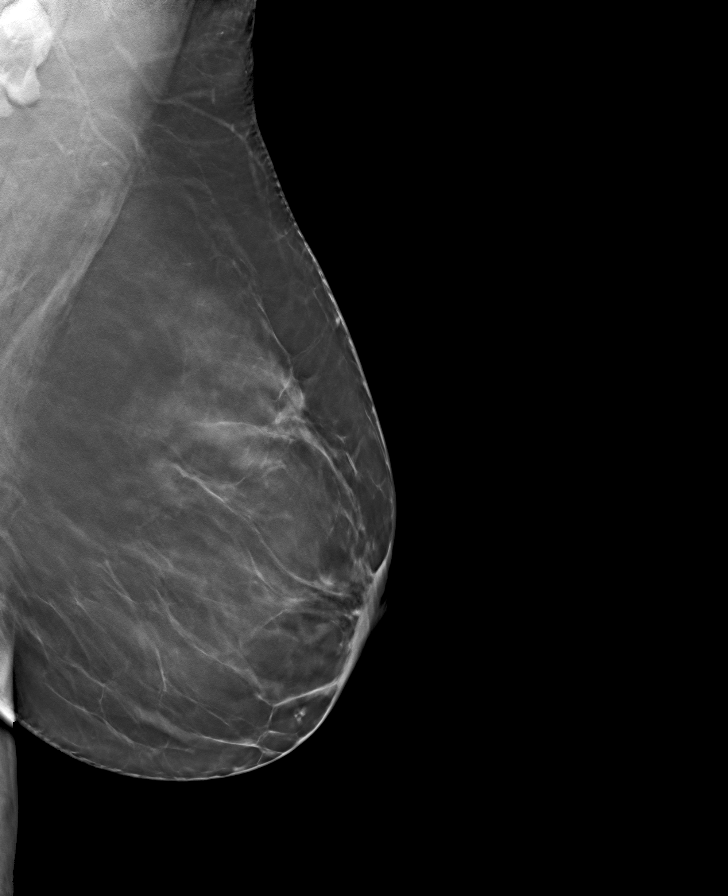

[L CC tomo · tomo slice 37/72.0]
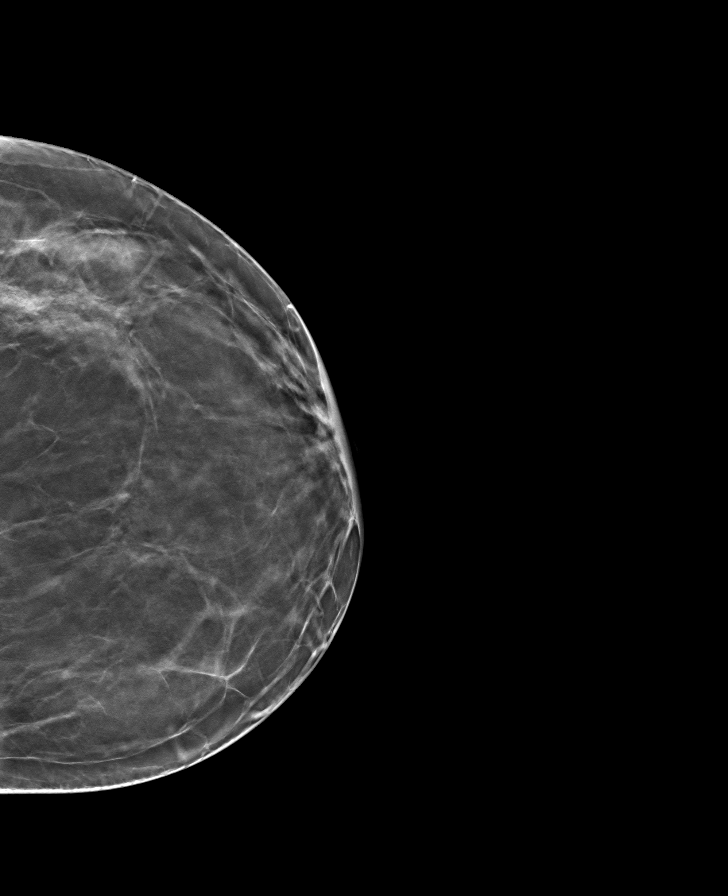

[R MLO tomo · tomo slice 41/82.0]
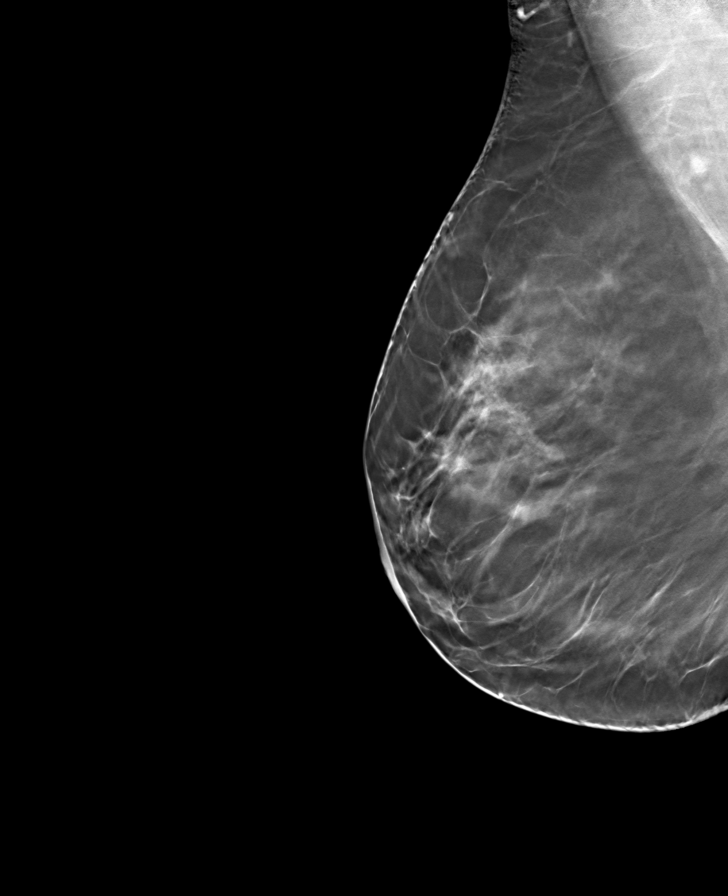

[8 of 24 positions shown; findings below may reference images not displayed]

ACR Breast Density Category b: There are scattered areas of
fibroglandular density.
FINDINGS: There are no findings suspicious for malignancy. Images were
processed with CAD.
IMPRESSION: No mammographic evidence of malignancy. A result letter of this
screening mammogram will be mailed directly to the patient.

RECOMMENDATION:
Screening mammogram in one year. (Code:[id number])

BI-RADS CATEGORY  1: Negative.

## 2020-09-02 ENCOUNTER — Other Ambulatory Visit: Payer: 59

## 2020-09-02 DIAGNOSIS — Z20822 Contact with and (suspected) exposure to covid-19: Secondary | ICD-10-CM

## 2020-09-05 LAB — NOVEL CORONAVIRUS, NAA: SARS-CoV-2, NAA: DETECTED — AB

## 2020-11-30 DIAGNOSIS — N912 Amenorrhea, unspecified: Secondary | ICD-10-CM | POA: Diagnosis not present

## 2020-12-07 DIAGNOSIS — Z369 Encounter for antenatal screening, unspecified: Secondary | ICD-10-CM | POA: Diagnosis not present

## 2020-12-07 DIAGNOSIS — N912 Amenorrhea, unspecified: Secondary | ICD-10-CM | POA: Diagnosis not present

## 2020-12-07 DIAGNOSIS — O09521 Supervision of elderly multigravida, first trimester: Secondary | ICD-10-CM | POA: Diagnosis not present

## 2020-12-09 DIAGNOSIS — N912 Amenorrhea, unspecified: Secondary | ICD-10-CM | POA: Diagnosis not present

## 2020-12-23 DIAGNOSIS — E349 Endocrine disorder, unspecified: Secondary | ICD-10-CM | POA: Diagnosis not present

## 2023-10-21 ENCOUNTER — Telehealth: Admitting: Physician Assistant

## 2023-10-21 DIAGNOSIS — J111 Influenza due to unidentified influenza virus with other respiratory manifestations: Secondary | ICD-10-CM

## 2023-10-21 NOTE — Progress Notes (Signed)
    Thank you for the details you included in the comment boxes. Those details are very helpful in determining the best course of treatment for you and help [place] to provide the best care.Because of the dizziness, we recommend that you schedule a Virtual Urgent Care video visit in order for the provider to better assess what is going on.  The provider will be able to give you a more accurate diagnosis and treatment plan if we can more freely discuss your symptoms and with the addition of a virtual examination.    If you change your visit to a video visit, we will bill your insurance (similar to an office visit) and you will not be charged for this e-Visit. You will be able to stay at home and speak with the first available [facility] advanced practice provider. The link to do a video visit is in the drop down Menu tab of your Welcome screen in MyChart.       I have spent 5 minutes in review of e-visit questionnaire, review and updating patient chart, medical decision making and response to patient.   Kasandra Knudsen Mayers, PA-C
